# Patient Record
Sex: Male | Born: 1985 | Race: White | Hispanic: No | Marital: Single | Smoking: Never smoker
Health system: Southern US, Community
[De-identification: ages and names within clinical notes are randomized; demographics above are authoritative.]

## PROBLEM LIST (undated history)

## (undated) DIAGNOSIS — J45909 Unspecified asthma, uncomplicated: Secondary | ICD-10-CM

## (undated) DIAGNOSIS — N189 Chronic kidney disease, unspecified: Secondary | ICD-10-CM

## (undated) DIAGNOSIS — S92909A Unspecified fracture of unspecified foot, initial encounter for closed fracture: Secondary | ICD-10-CM

## (undated) DIAGNOSIS — Z9889 Other specified postprocedural states: Secondary | ICD-10-CM

## (undated) HISTORY — PX: SHOULDER SURGERY: SHX246

## (undated) HISTORY — PX: EYE SURGERY: SHX253

---
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
## 2006-11-28 DIAGNOSIS — Z9889 Other specified postprocedural states: Secondary | ICD-10-CM

## 2006-11-28 HISTORY — DX: Other specified postprocedural states: Z98.890

## 2006-12-24 IMAGING — CT CT EXTREM UP W/O CM*L*
2 of 3 series · 13 of 20 positions shown, 16 images · IV contrast (agent unspecified)
Comparison: none

CLINICAL DATA: Scaphoid fracture with persistent pain.  
 CT LEFT WRIST W/O CONTRAST: 
 The scan demonstrates that there is still evidence of a portion of the scaphoid fracture through the mid body.  The fracture is oblique in position but there is evidence of complete bony bridging of the majority of the fracture.  However, a portion of the fracture line is still apparent and has developed some slightly sclerotic margins.  There is no evidence of increased density in the proximal or distal aspects of the scaphoid to suggest avascular necrosis.  The other bony structures of the wrist appear normal.  Alignment and position of the carpal bones appears anatomic.

[Series 2: — · axial · 0.23mm/px · z∈[-35,+47]mm · 12 of 158 slices shown, 15 images]
[im 13/158  soft-tissue]
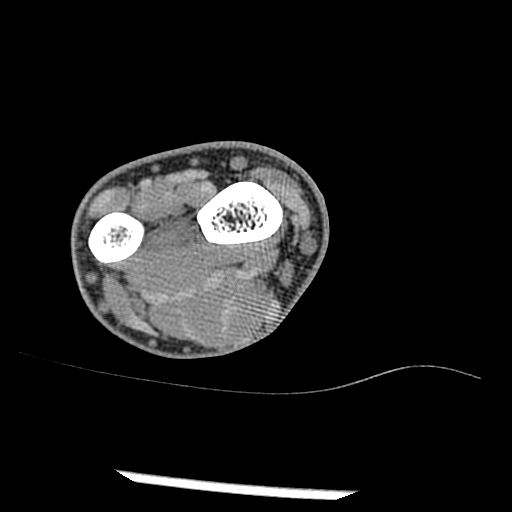
[im 13/158  bone]
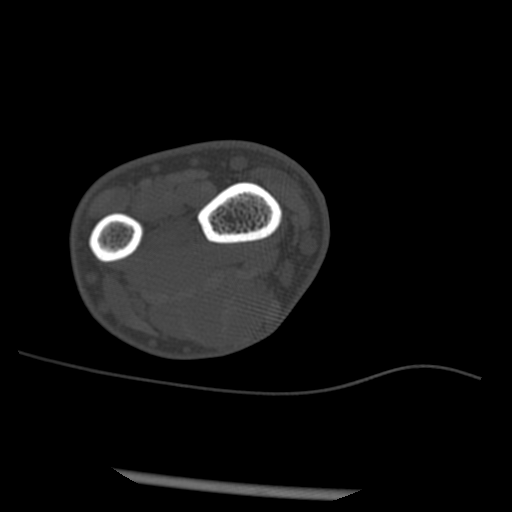
[im 25/158  bone]
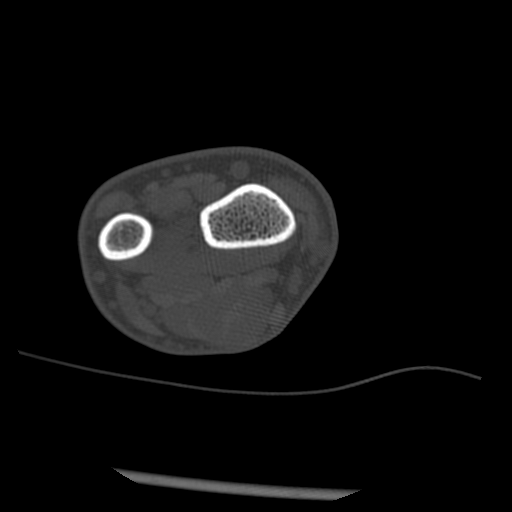
[im 37/158  bone]
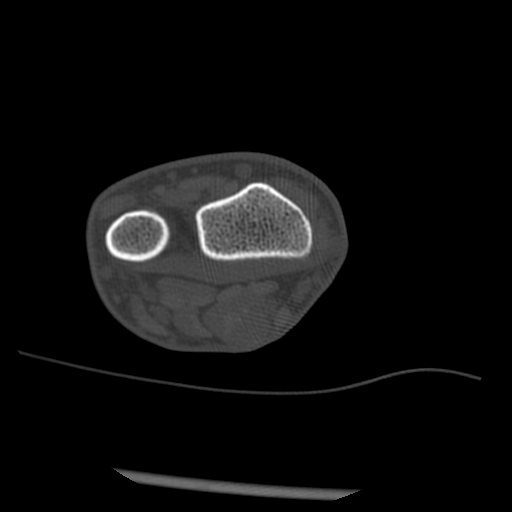
[im 49/158  bone]
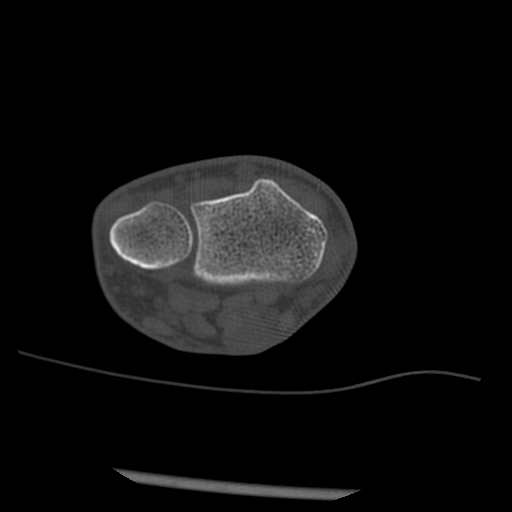
[im 61/158  soft-tissue]
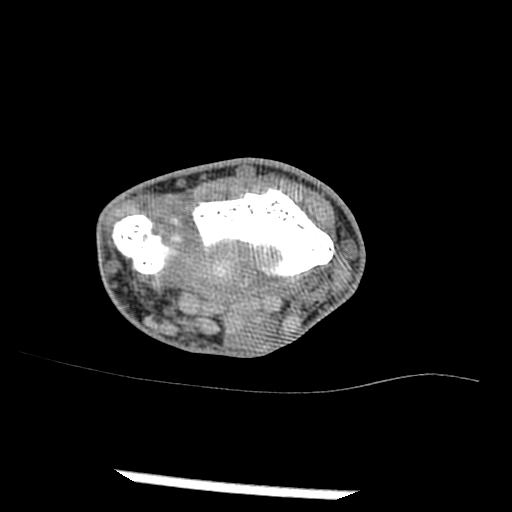
[im 61/158  bone]
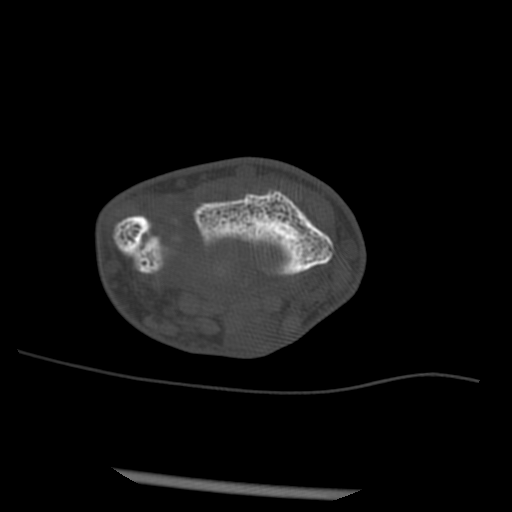
[im 73/158  bone]
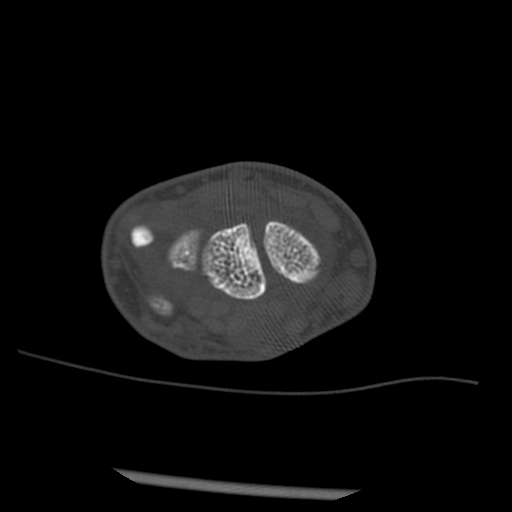
[im 85/158  bone]
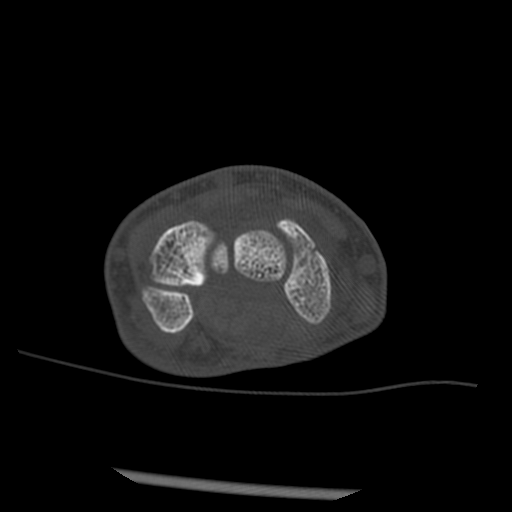
[im 97/158  bone]
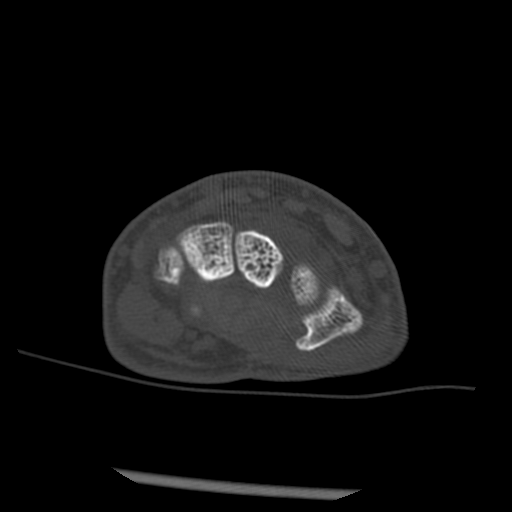
[im 109/158  soft-tissue]
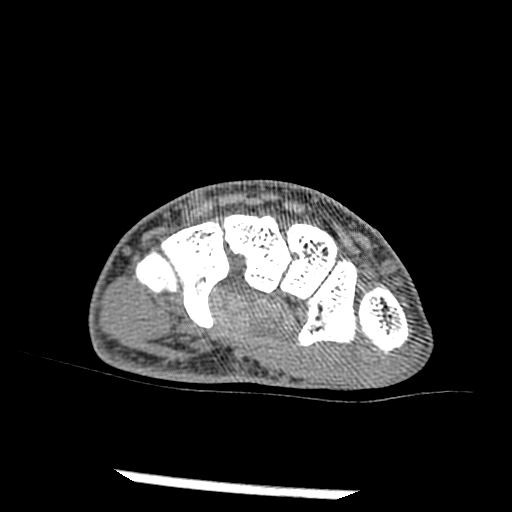
[im 109/158  bone]
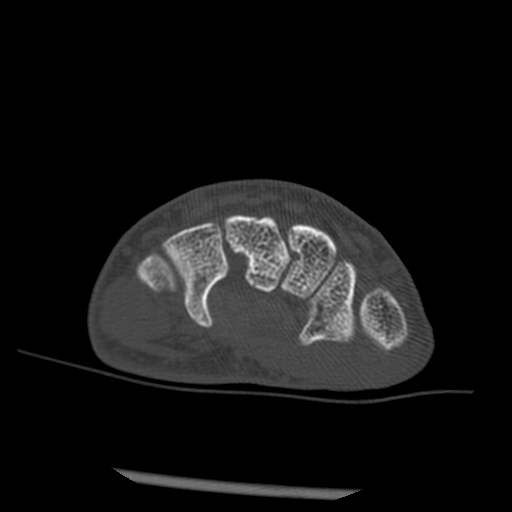
[im 121/158  bone]
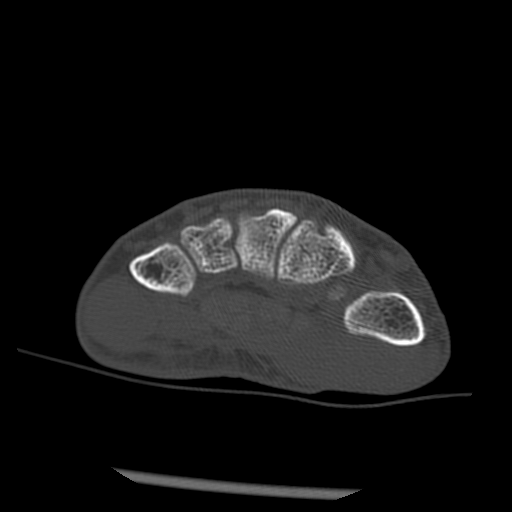
[im 133/158  bone]
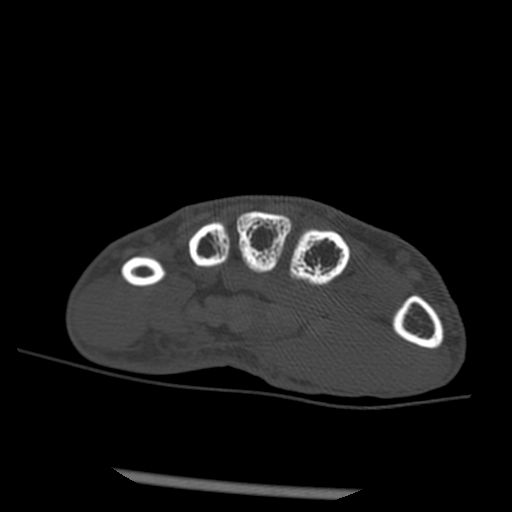
[im 145/158  bone]
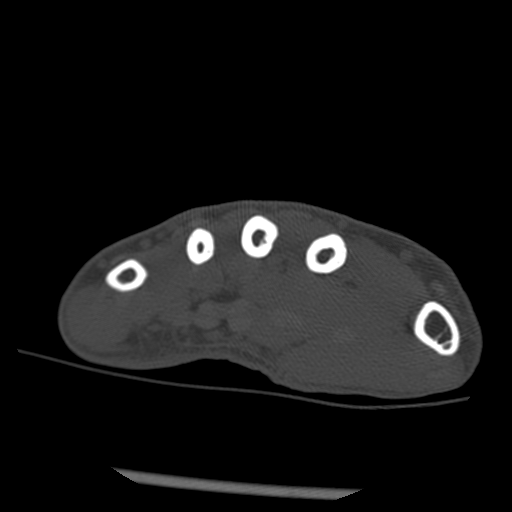

[Series 200: reformatted · sagittal · 0.23mm/px · 1 of 40 slices shown]
[im 20/40  bone]
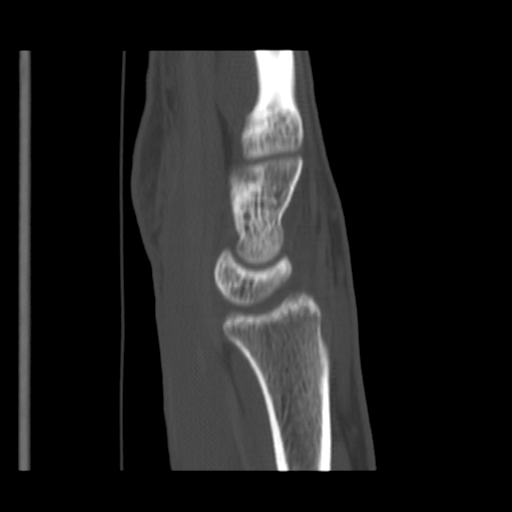

[13 of 20 positions shown; findings below may reference images not displayed]

IMPRESSION: There appears to be solid bony healing of the scaphoid fracture.  However, a portion of the fracture line is still apparent.  This is not felt to be significant.  No evidence on this scan of avascular necrosis.

## 2007-05-16 ENCOUNTER — Emergency Department (HOSPITAL_COMMUNITY): Admission: EM | Admit: 2007-05-16 | Discharge: 2007-05-16 | Payer: Self-pay | Admitting: Emergency Medicine

## 2007-06-24 ENCOUNTER — Emergency Department (HOSPITAL_COMMUNITY): Admission: EM | Admit: 2007-06-24 | Discharge: 2007-06-24 | Payer: Self-pay | Admitting: Emergency Medicine

## 2007-06-27 ENCOUNTER — Emergency Department (HOSPITAL_COMMUNITY): Admission: EM | Admit: 2007-06-27 | Discharge: 2007-06-28 | Payer: Self-pay | Admitting: Emergency Medicine

## 2007-09-07 ENCOUNTER — Emergency Department (HOSPITAL_COMMUNITY): Admission: EM | Admit: 2007-09-07 | Discharge: 2007-09-08 | Payer: Self-pay | Admitting: Emergency Medicine

## 2007-10-30 ENCOUNTER — Ambulatory Visit (HOSPITAL_BASED_OUTPATIENT_CLINIC_OR_DEPARTMENT_OTHER): Admission: RE | Admit: 2007-10-30 | Discharge: 2007-10-30 | Payer: Self-pay | Admitting: Orthopedic Surgery

## 2009-01-05 ENCOUNTER — Emergency Department (HOSPITAL_COMMUNITY): Admission: EM | Admit: 2009-01-05 | Discharge: 2009-01-05 | Payer: Self-pay | Admitting: Emergency Medicine

## 2011-03-15 LAB — CBC
Hemoglobin: 16.5 g/dL (ref 13.0–17.0)
MCHC: 34.8 g/dL (ref 30.0–36.0)
MCV: 92.1 fL (ref 78.0–100.0)
RDW: 12 % (ref 11.5–15.5)
WBC: 18.4 10*3/uL — ABNORMAL HIGH (ref 4.0–10.5)

## 2011-03-15 LAB — URINALYSIS, ROUTINE W REFLEX MICROSCOPIC
Hgb urine dipstick: NEGATIVE
Nitrite: NEGATIVE
Protein, ur: NEGATIVE mg/dL
Specific Gravity, Urine: 1.029 (ref 1.005–1.030)
Urobilinogen, UA: 1 mg/dL (ref 0.0–1.0)
pH: 7.5 (ref 5.0–8.0)

## 2011-03-15 LAB — BASIC METABOLIC PANEL
CO2: 26 mEq/L (ref 19–32)
Creatinine, Ser: 0.92 mg/dL (ref 0.4–1.5)
GFR calc Af Amer: >60 mL/min (ref >60)
Potassium: 3.4 mEq/L — ABNORMAL LOW (ref 3.5–5.1)
Sodium: 137 mEq/L (ref 135–145)

## 2011-04-12 NOTE — Op Note (Signed)
[name], [name]               ACCOUNT NO.:  [id number]   MEDICAL RECORD NO.:  [id number]          PATIENT TYPE:  AMB   LOCATION:  [facility]                         FACILITY:  [facility]   PHYSICIAN:  Deidre Ala, M.D.    DATE OF BIRTH:  1986-07-10   DATE OF PROCEDURE:  10/30/2007  DATE OF DISCHARGE:                               OPERATIVE REPORT   PREOPERATIVE DIAGNOSES:  1. 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  2. Additional labral tearing anterior superior labrum.   POSTOPERATIVE DIAGNOSES:  1. Multiple episodes of anterior dislocation with Bankart lesion left      shoulder.  2. Additional labral tearing anterior superior labrum.  3. Sloping of anterior glenoid.   OPERATION:  1. Left shoulder open Bankart repair with two BioCorkscrew, one      anterior superior, one anterior-inferior.  2. Near capsulorrhaphy in the ER.   SURGEON:  1. Charlesetta Shanks, M.D.   ASSISTANT:  Phineas Semen, P.A.C.   ANESTHESIA:  General endotracheal with scalene nerve block.   CULTURES:  None.   DRAINS:  None.   BLOOD LOSS:  Less than 100 mL.   REPLACED:  Without.   PATHOLOGIC FINDINGS AND HISTORY:  Richard [name] is a 25 year old who has had  multiple shoulder dislocations starting in October 2007.  He is a  Designer, jewellery.  MRI scan was consistent with a Hill-Sachs lesion,  anterior superior labral tearing as well as anterior inferior labral  tearing with flattening of the posterior superior humeral head  consistent with prior episodes of dislocation.  At surgery, the labrum  was ripped from the anterior superior aspect of the glenoid as well as  anterior-inferior with sloping of the glenoid in this anterior inferior  section allowing dislocation.  The capsule was also stretched.  We  repaired this with a mid to superior anterior BioCorkscrew along the  glenoid rim and one anterior-inferior with two FiberWire sutures on  each, repaired through the  base of the labrum as well as the base of the  capsule which had been split, the more superior one for the superior  BioCorkscrew and the inferior one for the inferior BioCorkscrew with the  split capsule as per the technique of near, then crisscrossing the  capsule with ultimate closure of the crisscross of the anterior capsule  taking care of some posterior redundancy and having no excessive  anterior-posterior translational motion at closure.   PROCEDURE:  With adequate anesthesia obtained using endotracheal  technique and scalene block, the patient was placed in the supine beach  chair position.  The left shoulder was prepped and draped in standard  fashion.  After standard prepping and draping, an incision was then made  in the deltopectoral groove from the coracoid down.  Incision was  deepened sharply with a knife and hemostasis was obtained using the  Bovie electrocoagulator.  Dissection was then carried down to the  deltopectoral interval.  We retracted the cephalic vein lateral and  protected it and preserved it throughout the procedure.  I then  dissected down  bluntly to the precoracoid fascia.  I then released  partially the conjoined tendon off the lateral aspect of the coracoid.  Deep retractors were placed.  I then released with a needle point Bovie,  the subscapularis and tagged it with Ethibond sutures with external  rotation, peeling it off the anterior capsule.  I then split the capsule  into two leafs superior and inferior with the inferior leaf taken off  with external rotation underneath the neck.  This exposed [place] down to the  glenoid labrum.  I analyzed the superior tear as well as the anterior-  inferior, cleared the glenoid rim and an oblique angle placed with awl  and tapped a 5.5 x corkscrew anterior superiorly and one anterior  inferiorly and then sutured through the labrum and then through the base  of the capsule with the two sutures of FiberWire and  tightened them down  securely with the knots down to the bone.  This reduced the redundancy  of the capsule as well as reduced the labrum back to the anterior  glenoid.  We then, with humeral head well located with neutral to slight  internal rotation, placed the superior capsule somewhat downward,  crisscrossing it, closing it back to the lateral side with interrupted  #1 Ethibond and the inferior limb up and crisscrossing up and over  closing that also to the lateral side and to the front of the capsule  with interrupted #1 Ethibond sutures.  We then tested anterior posterior  translation and felt it to be satisfactory.  Irrigation was carried out.  We repaired the subscapularis back to the lateral side with some up and  down sutures also using #1 Ethibond.  We then repaired the release of  the conjoined tendon with #1 Ethibond figure-of-eight.  Further  irrigation was carried out.  The wound was then closed with a running 2-  0 Vicryl subcuticular and a 3-0 Monocryl subcuticular with Steri-Strips.  Bulky sterile compressive dressing was applied with sling and the  patient was awakened having procedure well, taken to the recovery room  in satisfactory condition for routine postoperative care, given Percocet  for pain and told call the office for appointment for recheck on Friday.           ______________________________  V. Charlesetta Shanks, M.D.     VEP/MEDQ  D:  10/30/2007  T:  10/30/2007  Job:  [id number]

## 2011-09-05 LAB — POCT HEMOGLOBIN-HEMACUE
Hemoglobin: 16.7
Operator id: 118191

## 2012-05-16 ENCOUNTER — Encounter (HOSPITAL_COMMUNITY): Payer: Self-pay | Admitting: Emergency Medicine

## 2012-05-16 DIAGNOSIS — J45909 Unspecified asthma, uncomplicated: Secondary | ICD-10-CM | POA: Insufficient documentation

## 2012-05-16 DIAGNOSIS — S9700XA Crushing injury of unspecified ankle, initial encounter: Secondary | ICD-10-CM | POA: Insufficient documentation
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
## 2012-05-17 ENCOUNTER — Ambulatory Visit: Admission: RE | Admit: 2012-05-17 | Payer: 59 | Source: Ambulatory Visit

## 2012-05-17 ENCOUNTER — Emergency Department (HOSPITAL_COMMUNITY): Payer: 59

## 2012-05-17 ENCOUNTER — Other Ambulatory Visit: Payer: Self-pay | Admitting: Orthopedic Surgery

## 2012-05-17 ENCOUNTER — Ambulatory Visit
Admission: RE | Admit: 2012-05-17 | Discharge: 2012-05-17 | Disposition: A | Discharge status: Home / Self Care | Payer: 59 | Source: Ambulatory Visit | Attending: Orthopedic Surgery | Admitting: Orthopedic Surgery

## 2012-05-17 ENCOUNTER — Emergency Department (HOSPITAL_COMMUNITY)
Admission: EM | Admit: 2012-05-17 | Discharge: 2012-05-17 | Disposition: A | Discharge status: Home / Self Care | Payer: 59 | Attending: Emergency Medicine | Admitting: Emergency Medicine

## 2012-05-17 DIAGNOSIS — R52 Pain, unspecified: Secondary | ICD-10-CM

## 2012-05-17 DIAGNOSIS — R609 Edema, unspecified: Secondary | ICD-10-CM

## 2012-05-17 DIAGNOSIS — S9701XA Crushing injury of right ankle, initial encounter: Secondary | ICD-10-CM

## 2012-05-17 HISTORY — DX: Unspecified asthma, uncomplicated: J45.909

## 2012-05-17 MED ORDER — NAPROXEN 500 MG PO TABS: 500.0000 mg | ORAL_TABLET | Freq: Two times a day (BID) | ORAL | Status: AC

## 2012-05-17 MED ORDER — KETOROLAC TROMETHAMINE 60 MG/2ML IM SOLN
60.0000 mg | Freq: Once | INTRAMUSCULAR | Status: AC
  Administered 2012-05-17: 60 mg via INTRAMUSCULAR
  Filled 2012-05-17: qty 2

## 2012-05-17 NOTE — Discharge Instructions (Signed)
You have suffered a crush injury of your ankle and foot, there does appear to be a very slight chip fracture off of your ankle though it is unclear whether this is new or old. There are no other significant injuries to her foot or ankle that can be seen on plain x-rays. I have ordered an MRI of your ankle and foot for the morning to further evaluate this injury as well as to evaluate for infection. Please keep an antibiotic cream on your wound on the ankle, keep it closed covered and clean. Please call the orthopedic Dr. listed above for further evaluation of your ankle after MRI was done.  Naprosyn in addition to the percocet for pain, use crutches and the Ace wrap or ankle splint until you followup with the orthopedic Dr. Return to the hospital for severe or worsening pain, swelling or fevers. If there is no infection in this area he should continue to be swollen and bruised for several weeks as the blood is resorbed into your body in the swelling goes down.
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
## 2012-05-17 NOTE — ED Notes (Addendum)
Ice pac placed on pt rt ankle
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
## 2012-09-09 ENCOUNTER — Emergency Department (INDEPENDENT_AMBULATORY_CARE_PROVIDER_SITE_OTHER)
Admission: EM | Admit: 2012-09-09 | Discharge: 2012-09-09 | Disposition: A | Discharge status: Home / Self Care | Payer: 59 | Source: Home / Self Care | Attending: Family Medicine | Admitting: Family Medicine

## 2012-09-09 ENCOUNTER — Encounter (HOSPITAL_COMMUNITY): Payer: Self-pay | Admitting: Emergency Medicine

## 2012-09-09 DIAGNOSIS — H109 Unspecified conjunctivitis: Secondary | ICD-10-CM

## 2012-09-09 MED ORDER — TOBRAMYCIN-DEXAMETHASONE 0.3-0.1 % OP OINT: TOPICAL_OINTMENT | Freq: Three times a day (TID) | OPHTHALMIC | Status: DC

## 2012-09-09 NOTE — ED Provider Notes (Signed)
History     CSN: [id number]  Arrival date & time 09/09/12  0915   First MD Initiated Contact with Patient 09/09/12 [phone]      Chief Complaint  Patient presents with  . Eye Pain    (Consider location/radiation/quality/duration/timing/severity/associated sxs/prior treatment) Patient is a 26 y.o. male presenting with eye pain. The history is provided by the patient.  Eye Pain This is a recurrent problem. The current episode started 3 to 5 hours ago (recurrent problem from contact lenses, sees dr Nile Riggs, and usually given abx drops.). The problem has been gradually worsening.    History reviewed. No pertinent past medical history.  History reviewed. No pertinent past surgical history.  No family history on file.  History  Substance Use Topics  . Smoking status: Never Smoker   . Smokeless tobacco: Not on file  . Alcohol Use: No      Review of Systems  Constitutional: Negative.   HENT: Negative.   Eyes: Positive for pain, discharge and redness. Negative for itching and visual disturbance.    Allergies  Review of patient's allergies indicates no known allergies.  Home Medications   Current Outpatient Rx  Name Route Sig Dispense Refill  . TOBRAMYCIN-DEXAMETHASONE 0.3-0.1 % OP OINT Right Eye Place into the right eye 3 (three) times daily. 3.5 g 0    BP 125/78  Pulse 90  Temp 98.7 F (37.1 C) (Oral)  Resp 17  SpO2 100%  Physical Exam  Nursing note and vitals reviewed. Constitutional: He appears well-developed and well-nourished.  HENT:  Head: Normocephalic.  Right Ear: External ear normal.  Left Ear: External ear normal.  Eyes: EOM are normal. Pupils are equal, round, and reactive to light. Right conjunctiva is injected. Left conjunctiva is not injected.      ED Course  Procedures (including critical care time)  Labs Reviewed - No data to display No results found.   1. Conjunctivitis of right eye       MDM          Linna Hoff,  MD 09/09/12 [phone]

## 2012-09-09 NOTE — ED Notes (Signed)
Pt states he has frequent eye infections due to his contacts. Woke this am with eye irritation and redness.

## 2012-09-10 ENCOUNTER — Encounter (HOSPITAL_COMMUNITY): Payer: Self-pay | Admitting: Emergency Medicine

## 2012-12-17 ENCOUNTER — Emergency Department (HOSPITAL_COMMUNITY)
Admission: EM | Admit: 2012-12-17 | Discharge: 2012-12-17 | Disposition: A | Discharge status: Home / Self Care | Payer: 59 | Source: Home / Self Care | Attending: Emergency Medicine | Admitting: Emergency Medicine

## 2012-12-17 ENCOUNTER — Encounter (HOSPITAL_COMMUNITY): Payer: Self-pay | Admitting: *Deleted

## 2012-12-17 DIAGNOSIS — J111 Influenza due to unidentified influenza virus with other respiratory manifestations: Secondary | ICD-10-CM

## 2012-12-17 HISTORY — DX: Unspecified fracture of unspecified foot, initial encounter for closed fracture: S92.909A

## 2012-12-17 MED ORDER — ACETAMINOPHEN 325 MG PO TABS
975.0000 mg | ORAL_TABLET | Freq: Once | ORAL | Status: AC
  Administered 2012-12-17: 975 mg via ORAL

## 2012-12-17 MED ORDER — OSELTAMIVIR PHOSPHATE 75 MG PO CAPS: 75.0000 mg | ORAL_CAPSULE | Freq: Two times a day (BID) | ORAL | Status: DC

## 2012-12-17 MED ORDER — ACETAMINOPHEN 325 MG PO TABS
ORAL_TABLET | ORAL | Status: AC
  Filled 2012-12-17: qty 3

## 2012-12-17 NOTE — ED Notes (Addendum)
C/o nausea, bodyaches, and fever onset last night.  He coughed all night but it subsided.  Took Tylenol Cold and Flu 2000 last night, and 0600.  Also headache and head feels hot.  No vomitiing or diarrhea.

## 2012-12-17 NOTE — ED Provider Notes (Signed)
Medical screening examination/treatment/procedure(s) were performed by resident physician or non-physician practitioner and as supervising physician I was immediately available for consultation/collaboration.   KINDL,JAMES DOUGLAS MD.    James D Kindl, MD 12/17/12 1835 
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
## 2013-10-07 ENCOUNTER — Emergency Department (HOSPITAL_COMMUNITY)
Admission: EM | Admit: 2013-10-07 | Discharge: 2013-10-07 | Disposition: A | Discharge status: Home / Self Care | Payer: 59 | Attending: Emergency Medicine | Admitting: Emergency Medicine

## 2013-10-07 ENCOUNTER — Encounter (HOSPITAL_COMMUNITY): Payer: Self-pay | Admitting: Emergency Medicine

## 2013-10-07 ENCOUNTER — Emergency Department (HOSPITAL_COMMUNITY): Payer: 59

## 2013-10-07 DIAGNOSIS — Z8781 Personal history of (healed) traumatic fracture: Secondary | ICD-10-CM | POA: Insufficient documentation

## 2013-10-07 DIAGNOSIS — Z791 Long term (current) use of non-steroidal anti-inflammatories (NSAID): Secondary | ICD-10-CM | POA: Insufficient documentation

## 2013-10-07 DIAGNOSIS — J45909 Unspecified asthma, uncomplicated: Secondary | ICD-10-CM | POA: Insufficient documentation

## 2013-10-07 DIAGNOSIS — Z792 Long term (current) use of antibiotics: Secondary | ICD-10-CM | POA: Insufficient documentation

## 2013-10-07 DIAGNOSIS — N201 Calculus of ureter: Secondary | ICD-10-CM | POA: Insufficient documentation

## 2013-10-07 DIAGNOSIS — R112 Nausea with vomiting, unspecified: Secondary | ICD-10-CM | POA: Insufficient documentation

## 2013-10-07 DIAGNOSIS — Z79899 Other long term (current) drug therapy: Secondary | ICD-10-CM | POA: Insufficient documentation

## 2013-10-07 DIAGNOSIS — N23 Unspecified renal colic: Secondary | ICD-10-CM

## 2013-10-07 DIAGNOSIS — R319 Hematuria, unspecified: Secondary | ICD-10-CM | POA: Insufficient documentation

## 2013-10-07 LAB — URINE MICROSCOPIC-ADD ON

## 2013-10-07 LAB — CBC WITH DIFFERENTIAL/PLATELET
Basophils Absolute: 0 10*3/uL (ref 0.0–0.1)
Basophils Relative: 0 % (ref 0–1)
Eosinophils Absolute: 0.4 10*3/uL (ref 0.0–0.7)
Eosinophils Relative: 4 % (ref 0–5)
Hemoglobin: 16.5 g/dL (ref 13.0–17.0)
Lymphocytes Relative: 19 % (ref 12–46)
Lymphs Abs: 1.5 10*3/uL (ref 0.7–4.0)
MCH: 31.9 pg (ref 26.0–34.0)
MCHC: 36.6 g/dL — ABNORMAL HIGH (ref 30.0–36.0)
Monocytes Absolute: 0.4 10*3/uL (ref 0.1–1.0)
Monocytes Relative: 5 % (ref 3–12)
Neutro Abs: 5.7 10*3/uL (ref 1.7–7.7)
Platelets: 255 10*3/uL (ref 150–400)
RDW: 11.6 % (ref 11.5–15.5)
WBC: 8 10*3/uL (ref 4.0–10.5)

## 2013-10-07 LAB — COMPREHENSIVE METABOLIC PANEL
ALT: 18 U/L (ref 0–53)
AST: 19 U/L (ref 0–37)
Albumin: 4.4 g/dL (ref 3.5–5.2)
BUN: 14 mg/dL (ref 6–23)
CO2: 31 mEq/L (ref 19–32)
Calcium: 9.4 mg/dL (ref 8.4–10.5)
Chloride: 99 mEq/L (ref 96–112)
Creatinine, Ser: 1.09 mg/dL (ref 0.50–1.35)
GFR calc non Af Amer: >90 mL/min (ref >90)
Glucose, Bld: 126 mg/dL — ABNORMAL HIGH (ref 70–99)
Sodium: 139 mEq/L (ref 135–145)

## 2013-10-07 LAB — URINALYSIS, ROUTINE W REFLEX MICROSCOPIC
Bilirubin Urine: NEGATIVE
Glucose, UA: NEGATIVE mg/dL
Ketones, ur: NEGATIVE mg/dL
Specific Gravity, Urine: 1.024 (ref 1.005–1.030)
Urobilinogen, UA: 1 mg/dL (ref 0.0–1.0)

## 2013-10-07 MED ORDER — IBUPROFEN 800 MG PO TABS: 800.0000 mg | ORAL_TABLET | Freq: Three times a day (TID) | ORAL | Status: DC

## 2013-10-07 MED ORDER — ONDANSETRON HCL 8 MG PO TABS: 8.0000 mg | ORAL_TABLET | Freq: Three times a day (TID) | ORAL | Status: DC | PRN

## 2013-10-07 MED ORDER — OXYCODONE-ACETAMINOPHEN 5-325 MG PO TABS: 2.0000 | ORAL_TABLET | ORAL | Status: DC | PRN

## 2013-10-07 MED ORDER — IBUPROFEN 800 MG PO TABS
800.0000 mg | ORAL_TABLET | Freq: Once | ORAL | Status: AC
  Administered 2013-10-07: 800 mg via ORAL
  Filled 2013-10-07: qty 1

## 2013-10-07 MED ORDER — HYDROMORPHONE HCL PF 1 MG/ML IJ SOLN
1.0000 mg | Freq: Once | INTRAMUSCULAR | Status: DC
  Filled 2013-10-07: qty 1

## 2013-10-07 NOTE — ED Provider Notes (Signed)
CSN: [id number]     Arrival date & time 10/07/13  1130 History   First MD Initiated Contact with Patient 10/07/13 1321     Chief Complaint  Patient presents with  . Abdominal Pain  . Emesis   (Consider location/radiation/quality/duration/timing/severity/associated sxs/prior Treatment) HPI Complained of left flank pain onset 9 AM today, you by vomiting 2 or 3 times. Patient feels much improved presently pain is severe at the outset now mild. No treatment prior to coming here no nausea present. No fever . Pain is somewhat worse with sitting up improved with lying supine. Other associated symptoms:noted to have blood clot in urine and blood in semen afew days ago;not today Past Medical History  Diagnosis Date  . Asthma   . Fracture, foot     R foot   Past Surgical History  Procedure Laterality Date  . Shoulder surgery      repair dislocation left shoulder  . Eye surgery      "Lazy eye" both eyes   No family history on file. History  Substance Use Topics  . Smoking status: Never Smoker   . Smokeless tobacco: Not on file  . Alcohol Use: Yes     Comment: occasional    Review of Systems  Constitutional: Negative.   HENT: Negative.   Respiratory: Negative.   Cardiovascular: Negative.   Gastrointestinal: Positive for nausea and vomiting.  Genitourinary: Positive for hematuria and flank pain.       Blood in semen  Musculoskeletal: Negative.   Skin: Negative.   Neurological: Negative.   Psychiatric/Behavioral: Negative.   All other systems reviewed and are negative.    Allergies  Review of patient's allergies indicates no known allergies.  Home Medications   Current Outpatient Rx  Name  Route  Sig  Dispense  Refill  . meloxicam (MOBIC) 7.5 MG tablet   Oral   Take 7.5 mg by mouth daily.         Marland Kitchen oseltamivir (TAMIFLU) 75 MG capsule   Oral   Take 1 capsule (75 mg total) by mouth 2 (two) times daily. X 5 days   10 capsule   0   . oxyCODONE-acetaminophen  (PERCOCET) 10-325 MG per tablet   Oral   Take 1 tablet by mouth every 4 (four) hours as needed. For pain         . Pseudoephedrine-APAP-DM (TYLENOL COLD/FLU SEVERE DAY PO)   Oral   Take by mouth.         . tobramycin-dexamethasone (TOBRADEX) ophthalmic ointment   Right Eye   Place into the right eye 3 (three) times daily.   3.5 g   0    BP 129/77  Pulse 86  Temp(Src) 97.8 F (36.6 C) (Oral)  Resp 18  Wt 175 lb 8 oz (79.606 kg)  SpO2 99% Physical Exam  Nursing note and vitals reviewed. Constitutional: He appears well-developed and well-nourished.  HENT:  Head: Normocephalic and atraumatic.  Eyes: Conjunctivae are normal. Pupils are equal, round, and reactive to light.  Neck: Neck supple. No tracheal deviation present. No thyromegaly present.  Cardiovascular: Normal rate and regular rhythm.   No murmur heard. Pulmonary/Chest: Effort normal and breath sounds normal.  Abdominal: Soft. Bowel sounds are normal. He exhibits no distension. There is no tenderness.  Genitourinary:  Normal genitalia  Musculoskeletal: Normal range of motion. He exhibits no edema and no tenderness.  Neurological: He is alert. Coordination normal.  Skin: Skin is warm and dry. No rash noted.  Psychiatric:  He has a normal mood and affect.    ED Course  Procedures (including critical care time) Labs Review Labs Reviewed  CBC WITH DIFFERENTIAL - Abnormal; Notable for the following:    MCHC 36.6 (*)    All other components within normal limits  COMPREHENSIVE METABOLIC PANEL - Abnormal; Notable for the following:    Glucose, Bld 126 (*)    All other components within normal limits  URINALYSIS, ROUTINE W REFLEX MICROSCOPIC - Abnormal; Notable for the following:    Hgb urine dipstick SMALL (*)    All other components within normal limits  URINE MICROSCOPIC-ADD ON   Imaging Review No results found.  EKG Interpretation   None      at 4 PM patient requesting pain medicine. Ibuprofen  ordered. Results for orders placed during the hospital encounter of 10/07/13  CBC WITH DIFFERENTIAL      Result Value Range   WBC 8.0  4.0 - 10.5 K/uL   RBC 5.18  4.22 - 5.81 MIL/uL   Hemoglobin 16.5  13.0 - 17.0 g/dL   HCT 13.2  44.0 - 10.2 %   MCV 87.1  78.0 - 100.0 fL   MCH 31.9  26.0 - 34.0 pg   MCHC 36.6 (*) 30.0 - 36.0 g/dL   RDW 72.5  36.6 - 44.0 %   Platelets 255  150 - 400 K/uL   Neutrophils Relative % 71  43 - 77 %   Neutro Abs 5.7  1.7 - 7.7 K/uL   Lymphocytes Relative 19  12 - 46 %   Lymphs Abs 1.5  0.7 - 4.0 K/uL   Monocytes Relative 5  3 - 12 %   Monocytes Absolute 0.4  0.1 - 1.0 K/uL   Eosinophils Relative 4  0 - 5 %   Eosinophils Absolute 0.4  0.0 - 0.7 K/uL   Basophils Relative 0  0 - 1 %   Basophils Absolute 0.0  0.0 - 0.1 K/uL  COMPREHENSIVE METABOLIC PANEL      Result Value Range   Sodium 139  135 - 145 mEq/L   Potassium 4.0  3.5 - 5.1 mEq/L   Chloride 99  96 - 112 mEq/L   CO2 31  19 - 32 mEq/L   Glucose, Bld 126 (*) 70 - 99 mg/dL   BUN 14  6 - 23 mg/dL   Creatinine, Ser 3.47  0.50 - 1.35 mg/dL   Calcium 9.4  8.4 - 42.5 mg/dL   Total Protein 7.9  6.0 - 8.3 g/dL   Albumin 4.4  3.5 - 5.2 g/dL   AST 19  0 - 37 U/L   ALT 18  0 - 53 U/L   Alkaline Phosphatase 71  39 - 117 U/L   Total Bilirubin 0.8  0.3 - 1.2 mg/dL   GFR calc non Af Amer >90  >90 mL/min   GFR calc Af Amer >90  >90 mL/min  URINALYSIS, ROUTINE W REFLEX MICROSCOPIC      Result Value Range   Color, Urine YELLOW  YELLOW   APPearance CLEAR  CLEAR   Specific Gravity, Urine 1.024  1.005 - 1.030   pH 6.5  5.0 - 8.0   Glucose, UA NEGATIVE  NEGATIVE mg/dL   Hgb urine dipstick SMALL (*) NEGATIVE   Bilirubin Urine NEGATIVE  NEGATIVE   Ketones, ur NEGATIVE  NEGATIVE mg/dL   Protein, ur NEGATIVE  NEGATIVE mg/dL   Urobilinogen, UA 1.0  0.0 - 1.0 mg/dL   Nitrite NEGATIVE  NEGATIVE   Leukocytes, UA NEGATIVE  NEGATIVE  URINE MICROSCOPIC-ADD ON      Result Value Range   WBC, UA 0-2  <3 WBC/hpf    RBC / HPF 7-10  <3 RBC/hpf   Bacteria, UA RARE  RARE   Ct Abdomen Pelvis Wo Contrast  10/07/2013   CLINICAL DATA:  Left flank pain. Hematuria.  EXAM: CT ABDOMEN AND PELVIS WITHOUT CONTRAST  TECHNIQUE: Multidetector CT imaging of the abdomen and pelvis was performed following the standard protocol without intravenous contrast.  COMPARISON:  None.  FINDINGS: 0.4 x 0.3 nodule in the right lower lobe, image 3 of series 3. 2 mm right lower lobe nodule observed, image 4 of series 3.  Moderate left hydronephrosis and moderate left hydroureter associated with a 5 mm left UVJ calculus. 1-2 mm left kidney upper pole nonobstructive calculus. There are several 1-2 mm calculi scattered in the right kidney, nonobstructive. Normal appearance of the right ureter. Urinary bladder unremarkable.  The noncontrast CT appearance of the liver, spleen, pancreas, and adrenal glands is within normal limits. Dependent density in the gallbladder could be due to subtle gallstones or sludge. Appendix intact.  IMPRESSION: 1. Moderately obstructive 5 mm left UVJ calculus. Bilateral nonobstructive punctate 1-2 mm calculi. 2. Sludge versus subtle gallstones in the gallbladder. 3. Tiny nodule in the right lower lobe, statistically highly likely to be postinflammatory given the patient's age.   Electronically Signed   By: Herbie Baltimore M.D.   On: 10/07/2013 15:03    MDM  No diagnosis found. Plan prescription Percocet, Zofran, ibuprofen. Referral urology [facility] [place], [facility] Diagnosis #1ureteraL COLIC #2 Roselie Skinner    Doug Sou, MD 10/07/13 1624

## 2013-10-07 NOTE — ED Notes (Signed)
Pt states he got up and ate breakfast and went to work, then he developed some cramping pain to lower left stomach. Pt also has had some nausea and vomiting. Pt states he has not been around anyone that has been sick. Pt states he has vomited twice since this morning. Pt denies any diarrhea.

## 2013-10-07 NOTE — ED Notes (Signed)
Pt is here with LLQ pain and vomiting for the last hour.

## 2013-10-08 ENCOUNTER — Other Ambulatory Visit: Payer: Self-pay | Admitting: Urology

## 2013-10-10 ENCOUNTER — Encounter (HOSPITAL_COMMUNITY): Payer: Self-pay | Admitting: Pharmacy Technician

## 2013-10-15 ENCOUNTER — Encounter (HOSPITAL_COMMUNITY): Payer: Self-pay | Admitting: *Deleted

## 2013-10-15 NOTE — Progress Notes (Signed)
Spoke to patient via phone,history obtained,updated.  Bring blue folder,insurance cards,picture ID,designated driver and living will,POA, if desires (to be placed on chart). Reinforced no aspirin(instructions to hold aspirin per your doctor), ibuprofen products 72 hours prior to procedure. No vitamins or herbal medicines 7 days prior to procedure.   Follow laxative instructions provided by urologist (office) and in blue folder. Wear easy on/off clothing and no jewelry except wedding rings and ear rings. Leave all other valuables at home. Verbalizes understanding of instructions  Arrive 1030 on 11 20 2014

## 2013-10-16 MED ORDER — GENTAMICIN SULFATE 40 MG/ML IJ SOLN
340.0000 mg | INTRAVENOUS | Status: AC
  Administered 2013-10-17: 340 mg via INTRAVENOUS
  Filled 2013-10-16 (×3): qty 8.5

## 2013-10-16 MED ORDER — GENTAMICIN SULFATE 40 MG/ML IJ SOLN
340.0000 mg | INTRAVENOUS | Status: DC
  Filled 2013-10-16: qty 8.5

## 2013-10-17 ENCOUNTER — Ambulatory Visit (HOSPITAL_COMMUNITY)
Admission: RE | Admit: 2013-10-17 | Discharge: 2013-10-17 | Disposition: A | Discharge status: Home / Self Care | Payer: 59 | Source: Ambulatory Visit | Attending: Urology | Admitting: Urology

## 2013-10-17 ENCOUNTER — Ambulatory Visit (HOSPITAL_COMMUNITY): Payer: 59

## 2013-10-17 ENCOUNTER — Encounter (HOSPITAL_COMMUNITY): Payer: Self-pay | Admitting: General Practice

## 2013-10-17 ENCOUNTER — Encounter (HOSPITAL_COMMUNITY)
Admission: RE | Disposition: A | Discharge status: Home / Self Care | Payer: Self-pay | Source: Ambulatory Visit | Attending: Urology

## 2013-10-17 DIAGNOSIS — N201 Calculus of ureter: Secondary | ICD-10-CM | POA: Insufficient documentation

## 2013-10-17 DIAGNOSIS — N189 Chronic kidney disease, unspecified: Secondary | ICD-10-CM | POA: Insufficient documentation

## 2013-10-17 DIAGNOSIS — J45909 Unspecified asthma, uncomplicated: Secondary | ICD-10-CM | POA: Insufficient documentation

## 2013-10-17 HISTORY — DX: Other specified postprocedural states: Z98.890

## 2013-10-17 HISTORY — DX: Chronic kidney disease, unspecified: N18.9

## 2013-10-17 SURGERY — LITHOTRIPSY, ESWL
Anesthesia: LOCAL | Laterality: Left

## 2013-10-17 MED ORDER — DIAZEPAM 5 MG PO TABS
10.0000 mg | ORAL_TABLET | ORAL | Status: AC
  Administered 2013-10-17: 10 mg via ORAL
  Filled 2013-10-17: qty 2

## 2013-10-17 MED ORDER — DIPHENHYDRAMINE HCL 25 MG PO CAPS
25.0000 mg | ORAL_CAPSULE | ORAL | Status: AC
  Administered 2013-10-17: 25 mg via ORAL
  Filled 2013-10-17: qty 1

## 2013-10-17 MED ORDER — SODIUM CHLORIDE 0.9 % IV SOLN
INTRAVENOUS | Status: DC
  Administered 2013-10-17: 11:00:00 via INTRAVENOUS

## 2013-10-17 NOTE — H&P (Signed)
Richard [name] is an 27 y.o. male.    Chief Complaint: Pre-Op Left Shockwave Lithotripsy  HPI:    1 - Left distal Ureteral Stone - Pt with 5mm left distal ureteral stone (400 HU, SSD 10cm) on w/u first episode renal colic by CT 10/07/2013. Several bilateral punctate renal calcifications as well. Cr 1.09. Stone visible on scout images at level of femoral head. Mild nausea and colic now well controlled on meds from ER. No fevers.  PMH sig for eye surgery, childhood asthma. No CV disease. No strong blood thinners.  Today [name] is seen to proceed with shockwave lithotripsy. No interval fevers or stone passage.  Most recent UA without infectious parameters.  Past Medical History  Diagnosis Date  . Asthma   . Fracture, foot     R foot  . Chronic kidney disease   . H/O shoulder surgery 2008    Past Surgical History  Procedure Laterality Date  . Shoulder surgery      repair dislocation left shoulder  . Eye surgery      "Lazy eye" both eyes    History reviewed. No pertinent family history. Social History:  reports that he has never smoked. He does not have any smokeless tobacco history on file. He reports that he drinks alcohol. He reports that he does not use illicit drugs.  Allergies: No Known Allergies  No prescriptions prior to admission    No results found for this or any previous visit (from the past 48 hour(s)). No results found.  Review of Systems  Constitutional: Negative.  Negative for fever and chills.  HENT: Negative.   Eyes: Negative.   Respiratory: Negative.   Cardiovascular: Negative.   Gastrointestinal: Negative.   Genitourinary: Negative.   Musculoskeletal: Negative.   Skin: Negative.   Neurological: Negative.   Endo/Heme/Allergies: Negative.   Psychiatric/Behavioral: Negative.     Height 5\' 8"  (1.727 m), weight 74.844 kg (165 lb). Physical Exam  Constitutional: He is oriented to person, place, and time. He appears well-developed and well-nourished.   HENT:  Head: Normocephalic and atraumatic.  Eyes: EOM are normal. Pupils are equal, round, and reactive to light.  Neck: Normal range of motion. Neck supple.  Cardiovascular: Normal rate.   Respiratory: Effort normal.  GI: Soft. Bowel sounds are normal.  Genitourinary: Penis normal.  Mild left CVAT  Musculoskeletal: Normal range of motion.  Neurological: He is alert and oriented to person, place, and time.  Skin: Skin is warm and dry.  Psychiatric: He has a normal mood and affect. His behavior is normal. Judgment and thought content normal.     Assessment/Plan  1 - Left distal Ureteral Stone - We rediscussed shockwave lithotripsy in detail as well as my "rule of 9s" with stones <37mm, less than 900 HU, and skin to stone distance <9cm having approximately 90% treatment success with single session of treatment. We then readdressed how stones that are larger, more dense, and in patients with less favorable anatomy have incrementally decreased success rates. We rediscussed risks including, bleeding, infection, hematoma, loss of kidney, need for staged therapy, need for adjunctive therapy and requirement to refrain from any anticoagulants, anti-platelet or aspirin-like products peri-procedureally.   After careful consideration, the patient has chosen to proceed today as planned.     Richard [name] 10/17/2013, 7:16 AM

## 2015-03-04 ENCOUNTER — Encounter (HOSPITAL_COMMUNITY)
Admission: EM | Disposition: A | Discharge status: Home / Self Care | Payer: Self-pay | Source: Home / Self Care | Attending: Emergency Medicine

## 2015-03-04 ENCOUNTER — Encounter (HOSPITAL_COMMUNITY): Payer: Self-pay | Admitting: *Deleted

## 2015-03-04 ENCOUNTER — Ambulatory Visit (HOSPITAL_COMMUNITY): Admit: 2015-03-04 | Payer: Self-pay | Admitting: Gastroenterology

## 2015-03-04 ENCOUNTER — Emergency Department (HOSPITAL_COMMUNITY)
Admission: EM | Admit: 2015-03-04 | Discharge: 2015-03-04 | Disposition: A | Discharge status: Home / Self Care | Payer: Managed Care, Other (non HMO) | Attending: Emergency Medicine | Admitting: Emergency Medicine

## 2015-03-04 DIAGNOSIS — T18108A Unspecified foreign body in esophagus causing other injury, initial encounter: Secondary | ICD-10-CM | POA: Diagnosis present

## 2015-03-04 DIAGNOSIS — T18128A Food in esophagus causing other injury, initial encounter: Secondary | ICD-10-CM | POA: Diagnosis not present

## 2015-03-04 DIAGNOSIS — R1310 Dysphagia, unspecified: Secondary | ICD-10-CM | POA: Diagnosis not present

## 2015-03-04 DIAGNOSIS — X58XXXA Exposure to other specified factors, initial encounter: Secondary | ICD-10-CM | POA: Diagnosis not present

## 2015-03-04 DIAGNOSIS — J45909 Unspecified asthma, uncomplicated: Secondary | ICD-10-CM | POA: Insufficient documentation

## 2015-03-04 DIAGNOSIS — Y929 Unspecified place or not applicable: Secondary | ICD-10-CM | POA: Diagnosis not present

## 2015-03-04 DIAGNOSIS — N189 Chronic kidney disease, unspecified: Secondary | ICD-10-CM | POA: Diagnosis not present

## 2015-03-04 HISTORY — PX: ESOPHAGOGASTRODUODENOSCOPY: SHX5428

## 2015-03-04 LAB — CBC WITH DIFFERENTIAL/PLATELET
BASOS ABS: 0 10*3/uL (ref 0.0–0.1)
Basophils Relative: 0 % (ref 0–1)
EOS ABS: 1 10*3/uL — AB (ref 0.0–0.7)
Eosinophils Relative: 13 % — ABNORMAL HIGH (ref 0–5)
HCT: 45.5 % (ref 39.0–52.0)
Hemoglobin: 16 g/dL (ref 13.0–17.0)
Lymphocytes Relative: 28 % (ref 12–46)
Lymphs Abs: 2.1 10*3/uL (ref 0.7–4.0)
MCH: 30.5 pg (ref 26.0–34.0)
MCHC: 35.2 g/dL (ref 30.0–36.0)
MCV: 86.8 fL (ref 78.0–100.0)
Monocytes Absolute: 0.5 10*3/uL (ref 0.1–1.0)
Monocytes Relative: 7 % (ref 3–12)
Neutro Abs: 3.9 10*3/uL (ref 1.7–7.7)
Neutrophils Relative %: 52 % (ref 43–77)
PLATELETS: 273 10*3/uL (ref 150–400)
RBC: 5.24 MIL/uL (ref 4.22–5.81)
RDW: 12.1 % (ref 11.5–15.5)
WBC: 7.5 10*3/uL (ref 4.0–10.5)

## 2015-03-04 LAB — I-STAT CHEM 8, ED
BUN: 14 mg/dL (ref 6–23)
CALCIUM ION: 1.23 mmol/L (ref 1.12–1.23)
Chloride: 98 mmol/L (ref 96–112)
Creatinine, Ser: 0.9 mg/dL (ref 0.50–1.35)
GLUCOSE: 98 mg/dL (ref 70–99)
HEMATOCRIT: 48 % (ref 39.0–52.0)
Hemoglobin: 16.3 g/dL (ref 13.0–17.0)
Potassium: 3.7 mmol/L (ref 3.5–5.1)
Sodium: 140 mmol/L (ref 135–145)
TCO2: 26 mmol/L (ref 0–100)

## 2015-03-04 SURGERY — EGD (ESOPHAGOGASTRODUODENOSCOPY)
Anesthesia: Moderate Sedation

## 2015-03-04 MED ORDER — DIPHENHYDRAMINE HCL 50 MG/ML IJ SOLN
INTRAMUSCULAR | Status: AC
  Filled 2015-03-04: qty 1

## 2015-03-04 MED ORDER — GLUCAGON HCL RDNA (DIAGNOSTIC) 1 MG IJ SOLR
1.0000 mg | Freq: Once | INTRAMUSCULAR | Status: AC
  Administered 2015-03-04: 1 mg via INTRAVENOUS
  Filled 2015-03-04: qty 1

## 2015-03-04 MED ORDER — ONDANSETRON HCL 4 MG/2ML IJ SOLN
4.0000 mg | Freq: Once | INTRAMUSCULAR | Status: AC
  Administered 2015-03-04: 4 mg via INTRAVENOUS
  Filled 2015-03-04: qty 2

## 2015-03-04 MED ORDER — FENTANYL CITRATE 0.05 MG/ML IJ SOLN
INTRAMUSCULAR | Status: DC | PRN
  Administered 2015-03-04 (×2): 25 ug via INTRAVENOUS

## 2015-03-04 MED ORDER — MIDAZOLAM HCL 5 MG/ML IJ SOLN
INTRAMUSCULAR | Status: AC
  Filled 2015-03-04: qty 2

## 2015-03-04 MED ORDER — SODIUM CHLORIDE 0.9 % IV SOLN
INTRAVENOUS | Status: DC
  Administered 2015-03-04: 21:00:00 via INTRAVENOUS

## 2015-03-04 MED ORDER — MIDAZOLAM HCL 10 MG/2ML IJ SOLN
INTRAMUSCULAR | Status: DC | PRN
  Administered 2015-03-04 (×2): 2 mg via INTRAVENOUS

## 2015-03-04 MED ORDER — SODIUM CHLORIDE 0.9 % IV SOLN: INTRAVENOUS | Status: DC

## 2015-03-04 MED ORDER — FENTANYL CITRATE 0.05 MG/ML IJ SOLN
INTRAMUSCULAR | Status: AC
  Filled 2015-03-04: qty 2

## 2015-03-04 NOTE — Discharge Instructions (Signed)
Esophagogastroduodenoscopy °Care After °Refer to this sheet in the next few weeks. These instructions provide you with information on caring for yourself after your procedure. Your caregiver may also give you more specific instructions. Your treatment has been planned according to current medical practices, but problems sometimes occur. Call your caregiver if you have any problems or questions after your procedure.  °HOME CARE INSTRUCTIONS °· Do not eat or drink anything until the numbing medicine (local anesthetic) has worn off and your gag reflex has returned. You will know that the local anesthetic has worn off when you can swallow comfortably. °· Do not drive for 12 hours after the procedure or as directed by your caregiver. °· Only take medicines as directed by your caregiver. °SEEK MEDICAL CARE IF:  °· You cannot stop coughing. °· You are not urinating at all or less than usual. °SEEK IMMEDIATE MEDICAL CARE IF: °· You have difficulty swallowing. °· You cannot eat or drink. °· You have worsening throat or chest pain. °· You have dizziness, lightheadedness, or you faint. °· You have nausea or vomiting. °· You have chills. °· You have a fever. °· You have severe abdominal pain. °· You have black, tarry, or bloody stools. °Document Released: 10/31/2012 Document Reviewed: 10/31/2012 °ExitCare® Patient Information ©2015 [employer]. This information is not intended to replace advice given to you by your health care provider. Make sure you discuss any questions you have with your health care provider. ° °

## 2015-03-04 NOTE — ED Notes (Signed)
The pt has a piece of chicken caught in his throat since 1930.  He has had problems previously but the meat  Usually passes through.  Now he is unable to swallow his saliva .

## 2015-03-04 NOTE — ED Notes (Signed)
Pt unable to swallow water

## 2015-03-04 NOTE — Consult Note (Signed)
Reason for Consult: Food Impaction Referring Physician: ED  Sheran FavaJeremy P Wolpert HPI: This is a 29 year old male with a PMH of intermittent dysphagia who presented to the ER with complaints of food impaction.  He was eating a piece of fried chicken and immediately he felt it lodge in his esophagus.  He failed conservative measures in the ER and subsequently GI was called into evaluate and treat the patient.  Currently he is unable to manage his oral secretions.  The patient has a history of seasonal allergies and tends to notice more dysphagia issues during the Spring and Fall.  He denies any prior EGDs in the past, but he was recommended that he should see a GI physician for further evaluation and treatment.  No complaints of GERD.  Past Medical History  Diagnosis Date  . Asthma   . Fracture, foot     R foot  . Chronic kidney disease   . H/O shoulder surgery 2008    Past Surgical History  Procedure Laterality Date  . Shoulder surgery      repair dislocation left shoulder  . Eye surgery      "Lazy eye" both eyes    History reviewed. No pertinent family history.  Social History:  reports that he has never smoked. He does not have any smokeless tobacco history on file. He reports that he drinks alcohol. He reports that he does not use illicit drugs.  Allergies: No Known Allergies  Medications:  Scheduled:  Continuous: . sodium chloride 125 mL/hr at 03/04/15 2053    Results for orders placed or performed during the hospital encounter of 03/04/15 (from the past 24 hour(s))  CBC with Differential/Platelet     Status: Abnormal   Collection Time: 03/04/15  8:41 PM  Result Value Ref Range   WBC 7.5 4.0 - 10.5 K/uL   RBC 5.24 4.22 - 5.81 MIL/uL   Hemoglobin 16.0 13.0 - 17.0 g/dL   HCT 16.145.5 09.639.0 - 04.552.0 %   MCV 86.8 78.0 - 100.0 fL   MCH 30.5 26.0 - 34.0 pg   MCHC 35.2 30.0 - 36.0 g/dL   RDW 40.912.1 81.111.5 - 91.415.5 %   Platelets 273 150 - 400 K/uL   Neutrophils Relative % 52 43 - 77 %   Neutro Abs 3.9 1.7 - 7.7 K/uL   Lymphocytes Relative 28 12 - 46 %   Lymphs Abs 2.1 0.7 - 4.0 K/uL   Monocytes Relative 7 3 - 12 %   Monocytes Absolute 0.5 0.1 - 1.0 K/uL   Eosinophils Relative 13 (H) 0 - 5 %   Eosinophils Absolute 1.0 (H) 0.0 - 0.7 K/uL   Basophils Relative 0 0 - 1 %   Basophils Absolute 0.0 0.0 - 0.1 K/uL  I-stat chem 8, ed     Status: None   Collection Time: 03/04/15  9:07 PM  Result Value Ref Range   Sodium 140 135 - 145 mmol/L   Potassium 3.7 3.5 - 5.1 mmol/L   Chloride 98 96 - 112 mmol/L   BUN 14 6 - 23 mg/dL   Creatinine, Ser 7.820.90 0.50 - 1.35 mg/dL   Glucose, Bld 98 70 - 99 mg/dL   Calcium, Ion 9.561.23 2.131.12 - 1.23 mmol/L   TCO2 26 0 - 100 mmol/L   Hemoglobin 16.3 13.0 - 17.0 g/dL   HCT 08.648.0 57.839.0 - 46.952.0 %     No results found.  ROS:  As stated above in the HPI otherwise negative.  Blood pressure 146/102, pulse 95, temperature 98.2 F (36.8 C), temperature source Oral, resp. rate 15, SpO2 99 %.    PE: Gen: NAD, Alert and Oriented HEENT:  [place]/AT, EOMI Neck: Supple, no LAD Lungs: CTA Bilaterally CV: RRR without M/G/R ABM: Soft, NTND, +BS Ext: No C/C/E  Assessment/Plan: 1) Food Impaction. 2) History of Asthma.   With his history of asthma, I am suspicious that he has EoE as the source of his dysphagia.  I will perform an to resolve the food impaction.  Pending the findings, I may possibly dilate.  Plan: 1) EGD now.  Keyri Salberg D 03/04/2015, 10:30 PM

## 2015-03-04 NOTE — Op Note (Signed)
[facility] [address] [place], [id number]   ENDOSCOPY PROCEDURE REPORT  PATIENT: Richard [name], [name]  MR#: [id number] BIRTHDATE: October 23, 1986 , 28  yrs. old GENDER: male ENDOSCOPIST:Cortni Tays Elnoria HowardHung, MD REFERRED BY: PROCEDURE DATE:  03/04/2015 PROCEDURE:   EGD, diagnostic ASA CLASS:    Class II INDICATIONS: Food impaction. MEDICATION: Versed 4 mg IV and Fentanyl 50 mcg IV TOPICAL ANESTHETIC:   none  DESCRIPTION OF PROCEDURE:   After the risks and benefits of the procedure were explained, informed consent was obtained.  The [employer] G[employer]OR[employer]COPE [id number]  endoscope was introduced through the mouth  and advanced to the second portion of the duodenum .  The instrument was slowly withdrawn as the mucosa was fully examined. Estimated blood loss is zero unless otherwise noted in this procedure report.   FINDINGS: Upon entry i9nto the esophagus there was evidence of fluid and this was quickly suctioned.  The esophageal body exhibited concentric rings consistent with EoE.  In the distal esophagus the food bolus was identified and using gentle pressure with the endoscope, the meat bolus was pushed into the esopahgus.  A moderate amount of gastric contents were identified, but there were no overt abnormalities in the gastric lumen or the duodenal lumen. Biopsies and dilation was not able to be performed as a result of the patient's severe wretching.          The scope was then withdrawn from the patient and the procedure completed.  COMPLICATIONS: There were no immediate complications.  ENDOSCOPIC IMPRESSION: 1) Food impaction.  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  RECOMMENDATIONS: 1) Chew food well. 2) Follow up in the office in 1-2 weeks for repeat EGD with biopsy and dilation.   _______________________________ eSignedJeani Hawking:  Prajwal Fellner, MD 03/04/2015 10:48 PM     cc:  CPT CODES: ICD CODES:  The ICD and CPT codes recommended by this software  are interpretations from the data that the clinical staff has captured with the software.  The verification of the translation of this report to the ICD and CPT codes and modifiers is the sole responsibility of the health care institution and practicing physician where this report was generated.  [employer] will not be held responsible for the validity of the ICD and CPT codes included on this report.  AMA assumes no liability for data contained or not contained herein. CPT is a Publishing rights managerregistered trademark of the [employer].  PATIENT NAME:  Richard [name], Jordi P MR#: [id number]

## 2015-03-04 NOTE — ED Provider Notes (Signed)
CSN: [id number]     Arrival date & time 03/04/15  2016 History   First MD Initiated Contact with Patient 03/04/15 2029     Chief Complaint  Patient presents with  . fb esophagus      (Consider location/radiation/quality/duration/timing/severity/associated sxs/prior Treatment) HPI    PCP: No PCP Per Patient Blood pressure 136/93, pulse 99, temperature 98.2 F (36.8 C), temperature source Oral, SpO2 99 %.  Richard [name] is a 29 y.o.male with a significant PMH of asthma, CKD presents to the ER with complaints of chicken stuck in his esophagus. He reports that this happens every once in a while but that normally he is able to throw backup or drink fluids and push it all the way down. This is the first time he has not been able to remove the foreign body from his esophagus. The incident started at 7:30 PM this evening. He reports trying to drink water that then he throws it back up. He is not in any respiratory distress, he is speaking in full sentences and resting calmly. His vital signs are normal.  Negative Review of Symptoms: Diarrhea, fevers, confusion, shortness of breath, apnea.     Past Medical History  Diagnosis Date  . Asthma   . Fracture, foot     R foot  . Chronic kidney disease   . H/O shoulder surgery 2008   Past Surgical History  Procedure Laterality Date  . Shoulder surgery      repair dislocation left shoulder  . Eye surgery      "Lazy eye" both eyes   No family history on file. History  Substance Use Topics  . Smoking status: Never Smoker   . Smokeless tobacco: Not on file  . Alcohol Use: Yes     Comment: occasional    Review of Systems  10 Systems reviewed and are negative for acute change except as noted in the HPI.    Allergies  Review of patient's allergies indicates no known allergies.  Home Medications   Prior to Admission medications   Medication Sig Start Date End Date Taking? Authorizing Provider  Phenyleph-CPM-DM-APAP (TYLENOL COLD  MULTI-SYMPTOM PO) Take 2 tablets by mouth daily as needed (sinus).   Yes Historical Provider, MD  Pseudoephedrine HCl (SUDAFED PO) Take 2 tablets by mouth daily as needed (sinus pain).   Yes Historical Provider, MD   BP 133/88 mmHg  Pulse 109  Temp(Src) 98.2 F (36.8 C) (Oral)  SpO2 98% Physical Exam  Constitutional: He is oriented to person, place, and time. He appears well-developed and well-nourished. No distress.  HENT:  Head: Normocephalic and atraumatic.  Right Ear: External ear normal.  Left Ear: External ear normal.  Nose: Nose normal.  Mouth/Throat: Oropharynx is clear and moist.  No foreign body visualized in throat. Not drooling or tripoding on exam.  Eyes: Pupils are equal, round, and reactive to light.  Neck: Normal range of motion. Neck supple.  Cardiovascular: Normal rate, regular rhythm and normal heart sounds.   Pulmonary/Chest: Effort normal. No respiratory distress. He has no wheezes. He has no rales.  Abdominal: Soft. He exhibits no distension. There is no tenderness.  Neurological: He is alert and oriented to person, place, and time.  Skin: Skin is warm and dry. He is not diaphoretic.  Nursing note and vitals reviewed.   ED Course  Procedures (including critical care time) Labs Review Labs Reviewed  CBC WITH DIFFERENTIAL/PLATELET - Abnormal; Notable for the following:    Eosinophils Relative 13 (*)  Eosinophils Absolute 1.0 (*)    All other components within normal limits  I-STAT CHEM 8, ED    Imaging Review No results found.   EKG Interpretation None      MDM   Final diagnoses:  Foreign body in esophagus, initial encounter    Medications  0.9 %  sodium chloride infusion ( Intravenous New Bag/Given 03/04/15 2053)  ondansetron (ZOFRAN) injection 4 mg (4 mg Intravenous Given 03/04/15 2049)  glucagon (human recombinant) (GLUCAGEN) injection 1 mg (1 mg Intravenous Given 03/04/15 2049)    9: 45 pm- spoke with Dr. Elnoria Howard who will prepare the  endoscopy room for FB removal from esophagus. He anticipates having everything ready within 1-1.5 hours.  Filed Vitals:   03/04/15 2054  BP: 133/88  Pulse: 109  Temp:     Patient and family aware- he continues to be in no distress and resting comfortably.   Marlon Pel, PA-C 03/04/15 2151  Bethann Berkshire, MD 03/04/15 [phone]

## 2015-03-05 ENCOUNTER — Encounter (HOSPITAL_COMMUNITY): Payer: Self-pay | Admitting: Gastroenterology

## 2017-08-19 DIAGNOSIS — J029 Acute pharyngitis, unspecified: Secondary | ICD-10-CM | POA: Diagnosis not present

## 2017-08-19 DIAGNOSIS — Z5321 Procedure and treatment not carried out due to patient leaving prior to being seen by health care provider: Secondary | ICD-10-CM | POA: Diagnosis not present

## 2017-08-19 NOTE — ED Triage Notes (Signed)
Pt states after dinner he feels like he has a piece of steak stuck on his throat, been very difficult to swallow, he tried to use coke to help it down but no success, pt states he has prior hx of this before.

## 2017-08-20 ENCOUNTER — Emergency Department (HOSPITAL_COMMUNITY)
Admission: EM | Admit: 2017-08-20 | Discharge: 2017-08-20 | Disposition: A | Discharge status: Home / Self Care | Payer: 59 | Attending: Emergency Medicine | Admitting: Emergency Medicine

## 2017-08-20 NOTE — ED Notes (Signed)
Pt walked to the desk stating that his problem had resolved and he was feeling better. Pt also stated he was leaving. Pt was encouraged to still stay and be evaulated by a provider. Pt stated that this happens frequently and since his issue resolved he was still leaving. Pt was seen walking out the door.

## 2018-02-27 DIAGNOSIS — H109 Unspecified conjunctivitis: Secondary | ICD-10-CM | POA: Diagnosis not present

## 2020-10-19 DIAGNOSIS — H5203 Hypermetropia, bilateral: Secondary | ICD-10-CM | POA: Diagnosis not present

## 2024-05-30 ENCOUNTER — Other Ambulatory Visit: Payer: Self-pay

## 2024-05-30 ENCOUNTER — Emergency Department (HOSPITAL_COMMUNITY)

## 2024-05-30 ENCOUNTER — Encounter (HOSPITAL_COMMUNITY): Payer: Self-pay | Admitting: *Deleted

## 2024-05-30 ENCOUNTER — Encounter (HOSPITAL_COMMUNITY)
Admission: EM | Disposition: A | Discharge status: Home / Self Care | Payer: Self-pay | Source: Home / Self Care | Attending: Student

## 2024-05-30 ENCOUNTER — Emergency Department (EMERGENCY_DEPARTMENT_HOSPITAL)

## 2024-05-30 ENCOUNTER — Emergency Department (HOSPITAL_COMMUNITY)
Admission: EM | Admit: 2024-05-30 | Discharge: 2024-05-30 | Disposition: A | Discharge status: Home / Self Care | Attending: Student | Admitting: Student

## 2024-05-30 DIAGNOSIS — K2980 Duodenitis without bleeding: Secondary | ICD-10-CM | POA: Insufficient documentation

## 2024-05-30 DIAGNOSIS — X58XXXA Exposure to other specified factors, initial encounter: Secondary | ICD-10-CM | POA: Insufficient documentation

## 2024-05-30 DIAGNOSIS — K200 Eosinophilic esophagitis: Secondary | ICD-10-CM | POA: Insufficient documentation

## 2024-05-30 DIAGNOSIS — T18128A Food in esophagus causing other injury, initial encounter: Secondary | ICD-10-CM | POA: Insufficient documentation

## 2024-05-30 DIAGNOSIS — K222 Esophageal obstruction: Secondary | ICD-10-CM

## 2024-05-30 DIAGNOSIS — R1310 Dysphagia, unspecified: Secondary | ICD-10-CM | POA: Diagnosis not present

## 2024-05-30 DIAGNOSIS — J45909 Unspecified asthma, uncomplicated: Secondary | ICD-10-CM | POA: Diagnosis not present

## 2024-05-30 DIAGNOSIS — J8283 Eosinophilic asthma: Secondary | ICD-10-CM | POA: Insufficient documentation

## 2024-05-30 DIAGNOSIS — W44F3XA Food entering into or through a natural orifice, initial encounter: Secondary | ICD-10-CM | POA: Diagnosis not present

## 2024-05-30 HISTORY — PX: ESOPHAGOGASTRODUODENOSCOPY: SHX5428

## 2024-05-30 HISTORY — PX: FOREIGN BODY REMOVAL: SHX962

## 2024-05-30 SURGERY — EGD (ESOPHAGOGASTRODUODENOSCOPY)
Anesthesia: General

## 2024-05-30 MED ORDER — DEXAMETHASONE SODIUM PHOSPHATE 10 MG/ML IJ SOLN
INTRAMUSCULAR | Status: DC | PRN
  Administered 2024-05-30: 10 mg via INTRAVENOUS

## 2024-05-30 MED ORDER — LACTATED RINGERS IV BOLUS
1000.0000 mL | Freq: Once | INTRAVENOUS | Status: AC
  Administered 2024-05-30: 1000 mL via INTRAVENOUS

## 2024-05-30 MED ORDER — GLUCAGON HCL RDNA (DIAGNOSTIC) 1 MG IJ SOLR
1.0000 mg | Freq: Once | INTRAMUSCULAR | Status: AC
  Administered 2024-05-30: 1 mg via INTRAVENOUS
  Filled 2024-05-30: qty 1

## 2024-05-30 MED ORDER — FENTANYL CITRATE (PF) 250 MCG/5ML IJ SOLN
INTRAMUSCULAR | Status: DC | PRN
Start: 2024-05-30 — End: 2024-05-30
  Administered 2024-05-30 (×2): 50 ug via INTRAVENOUS

## 2024-05-30 MED ORDER — PANTOPRAZOLE SODIUM 40 MG PO TBEC: 40.0000 mg | DELAYED_RELEASE_TABLET | Freq: Two times a day (BID) | ORAL | 1 refills | Status: AC

## 2024-05-30 MED ORDER — MIDAZOLAM HCL 2 MG/2ML IJ SOLN
INTRAMUSCULAR | Status: DC | PRN
  Administered 2024-05-30: 2 mg via INTRAVENOUS

## 2024-05-30 MED ORDER — MIDAZOLAM HCL 2 MG/2ML IJ SOLN
INTRAMUSCULAR | Status: AC
  Filled 2024-05-30: qty 2

## 2024-05-30 MED ORDER — FENTANYL CITRATE (PF) 100 MCG/2ML IJ SOLN
INTRAMUSCULAR | Status: AC
  Filled 2024-05-30: qty 2

## 2024-05-30 MED ORDER — ONDANSETRON HCL 4 MG/2ML IJ SOLN
INTRAMUSCULAR | Status: DC | PRN
  Administered 2024-05-30: 4 mg via INTRAVENOUS

## 2024-05-30 MED ORDER — SUCCINYLCHOLINE CHLORIDE 200 MG/10ML IV SOSY
PREFILLED_SYRINGE | INTRAVENOUS | Status: DC | PRN
  Administered 2024-05-30: 100 mg via INTRAVENOUS

## 2024-05-30 MED ORDER — SODIUM CHLORIDE 0.9 % IV SOLN: INTRAVENOUS | Status: DC | PRN

## 2024-05-30 MED ORDER — PROPOFOL 10 MG/ML IV BOLUS
INTRAVENOUS | Status: DC | PRN
  Administered 2024-05-30: 200 mg via INTRAVENOUS

## 2024-05-30 NOTE — Anesthesia Postprocedure Evaluation (Signed)
 Anesthesia Post Note  Patient: Richard [name]  Procedure(s) Performed: EGD (ESOPHAGOGASTRODUODENOSCOPY) REMOVAL, FOREIGN BODY     Patient location during evaluation: PACU Anesthesia Type: General Level of consciousness: awake and alert Pain management: pain level controlled Vital Signs Assessment: post-procedure vital signs reviewed and stable Respiratory status: spontaneous breathing, nonlabored ventilation and respiratory function stable Cardiovascular status: blood pressure returned to baseline and stable Postop Assessment: no apparent nausea or vomiting Anesthetic complications: no   No notable events documented.  Last Vitals:  Vitals:   05/30/24 1110 05/30/24 1120  BP: 117/74 115/76  Pulse: 72 71  Resp: 13 14  Temp:    SpO2: 96% 95%    Last Pain:  Vitals:   05/30/24 1120  TempSrc:   PainSc: 0-No pain                 Charmika Macdonnell,W. EDMOND

## 2024-05-30 NOTE — Consult Note (Signed)
 Consultation  Referring Provider:     Lum Dose Primary Care Physician:  Pcp, No Primary Gastroenterologist:      Dr. Rollin   Reason for Consultation:     food impaction         HPI:   Richard [name] is a 38 y.o. male who has a history of seasonal allergies, chronic dysphagia, history of food impaction in 2016, history of esophageal stricture with dilation in the past reportedly (records not available), presenting with another food impaction.  He was in chicken nuggets yesterday at about 1130 and states 1 got stuck.  Since then he has not been able to swallow his secretions or any liquids.  He states he has chronic dysphagia to meats and chicken and can typically push this down by drinking water.  Unfortunately despite multiple attempts yesterday he has not been able to pass it.  He was given glucagon  in the ED which did not work.  He states he has had a few belches since he has been here but still intolerant of his secretions and feels that the food remains stuck.  He has not been taking any PPI.  There is been concern for EOE historically but states he has not seen Dr. Rollin in years.  He states last dilation really did not help and he has just been dealing with it and wants a cure.  He otherwise states his asthma is controlled and he denies any cardiopulmonary symptoms currently.  Last food impaction in 2016.    Past Medical History:  Diagnosis Date   Asthma    Chronic kidney disease    Fracture, foot    R foot   H/O shoulder surgery 2008    Past Surgical History:  Procedure Laterality Date   ESOPHAGOGASTRODUODENOSCOPY N/A 03/04/2015   Procedure: ESOPHAGOGASTRODUODENOSCOPY (EGD);  Surgeon: Belvie Rollin, MD;  Location: [facility] ENDOSCOPY;  Service: Endoscopy;  Laterality: N/A;   EYE SURGERY     Lazy eye both eyes   SHOULDER SURGERY     repair dislocation left shoulder    History reviewed. No pertinent family history.   Social History   Tobacco Use   Smoking status:  Never  Substance Use Topics   Alcohol use: Yes    Comment: occasional   Drug use: No    Prior to Admission medications   Medication Sig Start Date End Date Taking? Authorizing Provider  Phenyleph-CPM-DM-APAP (TYLENOL  COLD MULTI-SYMPTOM PO) Take 2 tablets by mouth daily as needed (sinus).    [provider]  Pseudoephedrine HCl (SUDAFED PO) Take 2 tablets by mouth daily as needed (sinus pain).    [provider]    No current facility-administered medications for this encounter.    Allergies as of 05/30/2024   (No Known Allergies)     Review of Systems:    As per HPI, otherwise negative    Physical Exam:  Vital signs in last 24 hours: Temp:  [97.4 F (36.3 C)-98.3 F (36.8 C)] 97.5 F (36.4 C) (07/03 0807) Pulse Rate:  [74-95] 81 (07/03 0807) Resp:  [16-18] 17 (07/03 0807) BP: (107-139)/(71-110) 136/95 (07/03 0807) SpO2:  [96 %-100 %] 96 % (07/03 0807) Weight:  [77.6 kg] 77.6 kg (07/03 0037)   General:   Pleasant male in NAD Head:  Normocephalic and atraumatic. Eyes:   No icterus.   Conjunctiva pink. Ears:  Normal auditory acuity. Neck:  Supple Lungs:  Respirations even and unlabored.  Heart:  Regular rate and rhythm  Abdomen:  Soft, nondistended, nontender. .  Rectal:  Not performed.  Msk:  Symmetrical without gross deformities.  Extremities:  Without edema. Neurologic:  Alert and  oriented x4;  grossly normal neurologically. Skin:  Intact without significant lesions or rashes. Psych:  Alert and cooperative. Normal affect.  LAB RESULTS: No results for input(s): WBC, HGB, HCT, PLT in the last 72 hours. BMET No results for input(s): NA, K, CL, CO2, GLUCOSE, BUN, CREATININE, CALCIUM in the last 72 hours. LFT No results for input(s): PROT, ALBUMIN, AST, ALT, ALKPHOS, BILITOT, BILIDIR, IBILI in the last 72 hours. PT/INR No results for input(s): LABPROT, INR in the last 72 hours.  STUDIES: DG Chest  Portable 1 View Result Date: 05/30/2024 CLINICAL DATA:  38 year old male with feeling of esophageal food impaction. Difficulty swallowing. EXAM: PORTABLE CHEST 1 VIEW COMPARISON:  Portable chest 05/14/2005. FINDINGS: Portable AP semi upright view at 0425 hours. Lower lung volumes. Normal cardiac size and mediastinal contours. Visualized tracheal air column is within normal limits. No gaseous distension of the esophagus is evident. Allowing for portable technique the lungs are clear. No pneumothorax or pleural effusion. Paucity of bowel gas in the upper abdomen. Chronic right lateral 6th rib fracture. No osseous abnormality identified. IMPRESSION: Low lung volumes.  No cardiopulmonary abnormality. Electronically Signed   By: VEAR Hurst M.D.   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  He has had chronic dysphagia to solids and a known stricture at the GEJ.  Here with the food impaction.  Recommend EGD to relieve food impaction and reevaluate his esophagus.  I discussed risk benefits of the procedure and anesthesia with him.  Risks include bleeding, perforation, etc.  He understands this and wishes to proceed.  Once we relieve the food impaction hopefully he can go home.  Likely will be recommending PPI and follow-up with Dr. Rollin for dilation of stricture in upcoming weeks assuming that is present in the cause of his dysphagia.  Further recommendations pending the results  Marcey Naval, MD [facility]

## 2024-05-30 NOTE — Anesthesia Preprocedure Evaluation (Addendum)
 Anesthesia Evaluation  Patient identified by MRN, date of birth, ID band Patient awake    Reviewed: Allergy & Precautions, H&P , NPO status , Patient's Chart, lab work & pertinent test results  Airway Mallampati: III  TM Distance: >3 FB Neck ROM: Full    Dental no notable dental hx. (+) Teeth Intact, Dental Advisory Given   Pulmonary asthma    Pulmonary exam normal breath sounds clear to auscultation       Cardiovascular negative cardio ROS  Rhythm:Regular Rate:Normal     Neuro/Psych negative neurological ROS  negative psych ROS   GI/Hepatic negative GI ROS, Neg liver ROS,,,  Endo/Other  negative endocrine ROS    Renal/GU negative Renal ROS  negative genitourinary   Musculoskeletal   Abdominal   Peds  Hematology negative hematology ROS (+)   Anesthesia Other Findings   Reproductive/Obstetrics negative OB ROS                              Anesthesia Physical Anesthesia Plan  ASA: 2  Anesthesia Plan: General   Post-op Pain Management: Minimal or no pain anticipated   Induction: Intravenous, Rapid sequence and Cricoid pressure planned  PONV Risk Score and Plan: 2 and Ondansetron  and Dexamethasone   Airway Management Planned: Oral ETT  Additional Equipment:   Intra-op Plan:   Post-operative Plan: Extubation in OR  Informed Consent: I have reviewed the patients History and Physical, chart, labs and discussed the procedure including the risks, benefits and alternatives for the proposed anesthesia with the patient or authorized representative who has indicated his/her understanding and acceptance.     Dental advisory given  Plan Discussed with: CRNA  Anesthesia Plan Comments:          Anesthesia Quick Evaluation

## 2024-05-30 NOTE — Discharge Instructions (Signed)

## 2024-05-30 NOTE — Anesthesia Procedure Notes (Signed)
 Procedure Name: Intubation Date/Time: 05/30/2024 9:37 AM  Performed by: Jerl Donald LABOR, CRNAPre-anesthesia Checklist: Patient identified, Emergency Drugs available, Suction available and Patient being monitored Patient Re-evaluated:Patient Re-evaluated prior to induction Oxygen Delivery Method: Circle system utilized Preoxygenation: Pre-oxygenation with 100% oxygen Induction Type: IV induction, Cricoid Pressure applied and Rapid sequence Laryngoscope Size: Miller and 2 Grade View: Grade I Tube type: Oral Tube size: 7.5 mm Number of attempts: 1 Airway Equipment and Method: Stylet and Oral airway Placement Confirmation: ETT inserted through vocal cords under direct vision, positive ETCO2 and breath sounds checked- equal and bilateral Secured at: 23 cm Tube secured with: Tape Dental Injury: Teeth and Oropharynx as per pre-operative assessment

## 2024-05-30 NOTE — ED Provider Notes (Signed)
 [place] EMERGENCY DEPARTMENT AT [facility] Provider Note  CSN: [id number] Arrival date & time: 05/30/24 9975  Chief Complaint(s) fb in his esophagus  HPI Richard [name] is a 38 y.o. male with PMH asthma, esophageal stricture status post esophageal dilatation 5 years ago by Dr. Rollin who presents emergency room for evaluation of suspected esophageal food bolus.  Patient states that around 11 AM he ate chicken nuggets and has been unable to tolerate liquids, solids or saliva since.  States that he feels similar to his previous food impaction.  Endorses some fullness in the lower chest but denies shortness of breath, abdominal pain, headache, fever or other systemic symptoms.   Past Medical History Past Medical History:  Diagnosis Date   Asthma    Chronic kidney disease    Fracture, foot    R foot   H/O shoulder surgery 2008   There are no active problems to display for this patient.  Home Medication(s) Prior to Admission medications   Medication Sig Start Date End Date Taking? Authorizing Provider  Phenyleph-CPM-DM-APAP (TYLENOL  COLD MULTI-SYMPTOM PO) Take 2 tablets by mouth daily as needed (sinus).    [provider]  Pseudoephedrine HCl (SUDAFED PO) Take 2 tablets by mouth daily as needed (sinus pain).    [provider]                                                                                                                                    Past Surgical History Past Surgical History:  Procedure Laterality Date   ESOPHAGOGASTRODUODENOSCOPY N/A 03/04/2015   Procedure: ESOPHAGOGASTRODUODENOSCOPY (EGD);  Surgeon: Belvie Rollin, MD;  Location: [facility] ENDOSCOPY;  Service: Endoscopy;  Laterality: N/A;   EYE SURGERY     Lazy eye both eyes   SHOULDER SURGERY     repair dislocation left shoulder   Family History History reviewed. No pertinent family history.  Social History Social History   Tobacco Use   Smoking status: Never  Substance Use  Topics   Alcohol use: Yes    Comment: occasional   Drug use: No   Allergies Patient has no known allergies.  Review of Systems Review of Systems  Gastrointestinal:  Positive for nausea and vomiting.    Physical Exam Vital Signs  I have reviewed the triage vital signs BP (!) 121/92 (BP Location: Left Arm)   Pulse 83   Temp (!) 97.4 F (36.3 C) (Temporal)   Resp 17   Ht 5' 7 (1.702 m)   Wt 77.6 kg   SpO2 100%   BMI 26.79 kg/m   Physical Exam Vitals and nursing note reviewed.  Constitutional:      Appearance: He is well-developed. He is ill-appearing.  HENT:     Head: Normocephalic and atraumatic.  Eyes:     Conjunctiva/sclera: Conjunctivae normal.  Cardiovascular:     Rate and Rhythm: Normal rate and regular rhythm.  Heart sounds: No murmur heard. Pulmonary:     Effort: Pulmonary effort is normal. No respiratory distress.     Breath sounds: Normal breath sounds.  Abdominal:     Palpations: Abdomen is soft.     Tenderness: There is no abdominal tenderness.  Musculoskeletal:        General: No swelling.     Cervical back: Neck supple.  Skin:    General: Skin is warm and dry.     Capillary Refill: Capillary refill takes less than 2 seconds.  Neurological:     Mental Status: He is alert.  Psychiatric:        Mood and Affect: Mood normal.     ED Results and Treatments Labs (all labs ordered are listed, but only abnormal results are displayed) Labs Reviewed - No data to display                                                                                                                        Radiology DG Chest Portable 1 View Result Date: 05/30/2024 CLINICAL DATA:  38 year old male with feeling of esophageal food impaction. Difficulty swallowing. EXAM: PORTABLE CHEST 1 VIEW COMPARISON:  Portable chest 05/14/2005. FINDINGS: Portable AP semi upright view at 0425 hours. Lower lung volumes. Normal cardiac size and mediastinal contours. Visualized tracheal air  column is within normal limits. No gaseous distension of the esophagus is evident. Allowing for portable technique the lungs are clear. No pneumothorax or pleural effusion. Paucity of bowel gas in the upper abdomen. Chronic right lateral 6th rib fracture. No osseous abnormality identified. IMPRESSION: Low lung volumes.  No cardiopulmonary abnormality. Electronically Signed   By: VEAR Hurst M.D.   On: 05/30/2024 04:59    Pertinent labs & imaging results that were available during my care of the patient were reviewed by me and considered in my medical decision making (see MDM for details).  Medications Ordered in ED Medications  lactated ringers bolus 1,000 mL (1,000 mLs Intravenous New Bag/Given 05/30/24 0422)  glucagon  (human recombinant) (GLUCAGEN ) injection 1 mg (1 mg Intravenous Given 05/30/24 0421)                                                                                                                                     Procedures Procedures  (including critical care time)  Medical Decision Making / ED Course   This patient presents to the  ED for concern of nausea, vomiting, this involves an extensive number of treatment options, and is a complaint that carries with it a high risk of complications and morbidity.  The differential diagnosis includes esophageal food bolus, esophageal stricture, mass, gastroenteritis  MDM: Patient seen emergency room for evaluation of nausea, vomiting and inability tolerate secretions.  Physical exam reveals an uncomfortable appearing patient with frequent spitting and inability to swallow without vomiting.  No crepitus felt in the chest wall.  X-ray without pneumomediastinum.  Spoke with Dr. Nandigam of gastroenterology who will coordinate with the morning team to have the patient scoped in the endoscopy suite.  Fluids and glucagon  given without improvement.  At time of signout, patient pending endoscopy.  Suspect patient will go to the endoscopy suite and  be discharged from there.   Additional history obtained:  -External records from outside source obtained and reviewed including: Chart review including previous notes, labs, imaging, consultation notes    Imaging Studies ordered: I ordered imaging studies including chest x-ray I independently visualized and interpreted imaging. I agree with the radiologist interpretation   Medicines ordered and prescription drug management: Meds ordered this encounter  Medications   lactated ringers bolus 1,000 mL   glucagon  (human recombinant) (GLUCAGEN ) injection 1 mg    -I have reviewed the patients home medicines and have made adjustments as needed  Critical interventions none  Consultations Obtained: I requested consultation with the gastroenterologist on-call Dr. Shila,  and discussed lab and imaging findings as well as pertinent plan - they recommend: Endoscopy in the a.m.   Cardiac Monitoring: The patient was maintained on a cardiac monitor.  I personally viewed and interpreted the cardiac monitored which showed an underlying rhythm of: NSR  Social Determinants of Health:  Factors impacting patients care include: none   Reevaluation: After the interventions noted above, I reevaluated the patient and found that they have :stayed the same  Co morbidities that complicate the patient evaluation  Past Medical History:  Diagnosis Date   Asthma    Chronic kidney disease    Fracture, foot    R foot   H/O shoulder surgery 2008      Dispostion: I considered admission for this patient, and patient will likely be taken to the endoscopy suite and discharged after endoscopy     Final Clinical Impression(s) / ED Diagnoses Final diagnoses:  None     @PCDICTATION @    Albertina Dixon, MD 05/30/24 [phone]

## 2024-05-30 NOTE — Op Note (Signed)
 [facility] Patient Name: Richard [name] Procedure Date : 05/30/2024 MRN: [id number] Attending MD: Elspeth SQUIBB. 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 Rollin for years. Chronic dysphagia. Asthma Providers:                Elspeth P. Leigh, MD, Ozell Pouch, [facility]                            [place], Technician, Donald Jerl BONINE Referring MD:              Medicines:                Monitored Anesthesia Care Complications:            No immediate complications. Estimated blood loss:                            Minimal. Estimated Blood Loss:     Estimated blood loss was minimal. Procedure:                Pre-Anesthesia Assessment:                           - Prior to the procedure, a History and Physical                            was performed, and patient medications and                            allergies were reviewed. The patient's tolerance of                            previous anesthesia was also reviewed. The risks                            and benefits of the procedure and the sedation                            options and risks were discussed with the patient.                            All questions were answered, and informed consent                            was obtained. Prior Anticoagulants: The patient has                            taken no anticoagulant or antiplatelet agents. ASA  Grade Assessment: II - A patient with mild systemic                            disease. After reviewing the risks and benefits,                            the patient was deemed in satisfactory condition to                            undergo the procedure.                           After obtaining informed consent, the endoscope was                             passed under direct vision. Throughout the                            procedure, the patient's blood pressure, pulse, and                            oxygen saturations were monitored continuously. The                            GIF-H190 ([id number] Olympus endoscope was introduced                            through the mouth, and advanced to the second part                            of duodenum. The upper GI endoscopy was                            accomplished without difficulty. The patient                            tolerated the procedure well. Scope In: Scope Out: Findings:      Food bolus (chicken) was found at the gastroesophageal junction. It       initially could not be pushed into the stomach. I tried placing both the       E-suction and the bander cap however it could not traverse the proximal       esophageal stricture noted at 25cm from the incisors. Removal was       accomplished with a rat-toothed forceps to break up the bolus which then       fell into the stomach. Significant edema / inflammation at the site of       impaction.      The Z-line was regular and was found 38 cm from the incisors.      Two benign-appearing, intrinsic stenoses were found at 25cm from the       incicors and 38 cm from the incisors, however the entire esophagus was       narrowed lumen with rings from EoE.      Mucosal changes including ringed esophagus, longitudinal furrows and  small-caliber esophagus were found in the middle third of the esophagus       and in the lower third of the esophagus. Biopsies were taken with a cold       forceps for histology.      The exam of the esophagus was otherwise normal.      The entire examined stomach was normal.      Patchy inflammation characterized by erythema was found in the duodenal       bulb. Biopsies were taken with a cold forceps for histology - rule out       eosinophilic enteritis.      The exam of the  duodenum was otherwise normal. Impression:               - Food at the gastroesophageal junction. Removal                            was successful.                           - Z-line regular, 38 cm from the incisors.                           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                           - Normal stomach.                           - Duodenitis. Biopsied - rule out eosinophilic                            enteritis.                           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 Recommendation:           - Assuming the patient can tolerate PO, can be                            discharged home (will need a ride)                           Patient has a contact number available for                            emergencies. The signs and symptoms of potential                            delayed complications were  discussed with the                            patient. Return to normal activities tomorrow.                            Written discharge instructions were provided to the                            patient.                           - Full liquid diet today and then soft diet                            tomorrow. Would stay on soft diet for the next 1-2                            weeks                           - NO MEAT OR CHICKEN until a follow up EGD with                            dilation can be done (Dr. Rollin)                           - Continue present medications.                           - Start protonix 40mg  twice daily                           - Anticipate starting topical budesonide                           - Patient should see an allergist for food allergy                            testing as outpatient                           - Await pathology results.                            - Follow up with Dr. Rollin for further management of                            EoE Procedure Code(s):        --- Professional ---                           [id number], Esophagogastroduodenoscopy, flexible,                            transoral; with removal of foreign body(s)  56760, Esophagogastroduodenoscopy, flexible,                            transoral; with biopsy, single or multiple Diagnosis Code(s):        --- Professional ---                           U81.871J, Food in esophagus causing other injury,                            initial encounter                           K22.2, Esophageal obstruction                           K22.89, Other specified disease of esophagus                           K29.80, Duodenitis without bleeding                           T18.108A, Unspecified foreign body in esophagus                            causing other injury, initial encounter CPT copyright 2022 [employer]. All rights reserved. The codes documented in this report are preliminary and upon coder review may  be revised to meet current compliance requirements. Elspeth P. Marquette Blodgett, MD 05/30/2024 10:14:26 AM This report has been signed electronically. Number of Addenda: 0

## 2024-05-30 NOTE — Transfer of Care (Signed)
 Immediate Anesthesia Transfer of Care Note  Patient: Richard [name]  Procedure(s) Performed: EGD (ESOPHAGOGASTRODUODENOSCOPY) REMOVAL, FOREIGN BODY  Patient Location: PACU and Endoscopy Unit  Anesthesia Type:General  Level of Consciousness: awake and drowsy  Airway & Oxygen Therapy: Patient Spontanous Breathing and Patient connected to face mask oxygen  Post-op Assessment: Report given to RN and Post -op Vital signs reviewed and stable  Post vital signs: Reviewed and stable  Last Vitals:  Vitals Value Taken Time  BP 124/89 05/30/24 10:20  Temp    Pulse 84 05/30/24 10:20  Resp 16 05/30/24 10:20  SpO2 98 % 05/30/24 10:20  Vitals shown include unfiled device data.  Last Pain:  Vitals:   05/30/24 0807  TempSrc: Temporal  PainSc: 2          Complications: No notable events documented.

## 2024-05-30 NOTE — ED Triage Notes (Signed)
 The pt reports that he has had  one chicken nugget in his esophagus since 1130a yesterday he has been been unable to dislodge   everything he attempts to swallow comes back  he has had problems in the past and has had his esophagus stretched once but had problems since then

## 2024-06-01 ENCOUNTER — Encounter (HOSPITAL_COMMUNITY): Payer: Self-pay | Admitting: Gastroenterology

## 2024-06-03 LAB — SURGICAL PATHOLOGY

## 2024-06-05 ENCOUNTER — Ambulatory Visit: Payer: Self-pay | Admitting: Gastroenterology
